# Patient Record
Sex: Male | Born: 1955 | Race: Black or African American | Hispanic: No | Marital: Married | Smoking: Never smoker
Health system: Southern US, Community
[De-identification: ages and names within clinical notes are randomized; demographics above are authoritative.]

## PROBLEM LIST (undated history)

## (undated) DIAGNOSIS — I10 Essential (primary) hypertension: Secondary | ICD-10-CM

## (undated) HISTORY — PX: KNEE SURGERY: SHX244

---

## 2020-04-27 ENCOUNTER — Other Ambulatory Visit: Payer: Self-pay

## 2020-04-27 ENCOUNTER — Emergency Department: Payer: Medicare Other

## 2020-04-27 ENCOUNTER — Emergency Department
Admission: EM | Admit: 2020-04-27 | Discharge: 2020-04-27 | Disposition: A | Discharge status: Home / Self Care | Payer: Medicare Other | Attending: Emergency Medicine | Admitting: Emergency Medicine

## 2020-04-27 DIAGNOSIS — S61213A Laceration without foreign body of left middle finger without damage to nail, initial encounter: Secondary | ICD-10-CM

## 2020-04-27 DIAGNOSIS — Z23 Encounter for immunization: Secondary | ICD-10-CM | POA: Insufficient documentation

## 2020-04-27 DIAGNOSIS — I10 Essential (primary) hypertension: Secondary | ICD-10-CM | POA: Insufficient documentation

## 2020-04-27 DIAGNOSIS — S62633B Displaced fracture of distal phalanx of left middle finger, initial encounter for open fracture: Secondary | ICD-10-CM | POA: Insufficient documentation

## 2020-04-27 DIAGNOSIS — W312XXA Contact with powered woodworking and forming machines, initial encounter: Secondary | ICD-10-CM | POA: Diagnosis not present

## 2020-04-27 DIAGNOSIS — S61211A Laceration without foreign body of left index finger without damage to nail, initial encounter: Secondary | ICD-10-CM | POA: Insufficient documentation

## 2020-04-27 DIAGNOSIS — S62639B Displaced fracture of distal phalanx of unspecified finger, initial encounter for open fracture: Secondary | ICD-10-CM

## 2020-04-27 HISTORY — DX: Essential (primary) hypertension: I10

## 2020-04-27 MED ORDER — TRAMADOL HCL 50 MG PO TABS: 50.0000 mg | ORAL_TABLET | Freq: Four times a day (QID) | ORAL | 0 refills | Status: DC | PRN

## 2020-04-27 MED ORDER — CEPHALEXIN 500 MG PO CAPS: 500.0000 mg | ORAL_CAPSULE | Freq: Four times a day (QID) | ORAL | 0 refills | Status: AC

## 2020-04-27 MED ORDER — BUPIVACAINE HCL (PF) 0.5 % IJ SOLN
10.0000 mL | Freq: Once | INTRAMUSCULAR | Status: DC
  Filled 2020-04-27: qty 10

## 2020-04-27 MED ORDER — TETANUS-DIPHTH-ACELL PERTUSSIS 5-2.5-18.5 LF-MCG/0.5 IM SUSY
0.5000 mL | PREFILLED_SYRINGE | Freq: Once | INTRAMUSCULAR | Status: AC
  Administered 2020-04-27: 0.5 mL via INTRAMUSCULAR
  Filled 2020-04-27: qty 0.5

## 2020-04-27 NOTE — ED Triage Notes (Signed)
Pt comes pov with lac across first and second finger on left hand from chainsaw. No through lacs. Bleeding controlled. Wrapped with gauze.

## 2020-04-27 NOTE — ED Notes (Signed)
Suture cart at bedside and suture kit

## 2020-04-27 NOTE — ED Provider Notes (Signed)
[facility] Emergency Department Provider Note  ____________________________________________   Event Date/Time   First MD Initiated Contact with Patient 04/27/20 1340     (approximate)  I have reviewed the triage vital signs and the nursing notes.   HISTORY  Chief Complaint Laceration    HPI Francis Guerrero is a 65 y.o. male presents emergency department complaining of laceration to the left hand today.  Patient was using a skill saw.  He was trying to build a fence.  Unsure of his last tetanus.  No numbness or tingling.  No other injuries reported - patient is right-handed   Past Medical History:  Diagnosis Date  . Hypertension     There are no problems to display for this patient.   Past Surgical History:  Procedure Laterality Date  . KNEE SURGERY Right     Prior to Admission medications   Medication Sig Start Date End Date Taking? Authorizing Provider  cephALEXin (KEFLEX) 500 MG capsule Take 1 capsule (500 mg total) by mouth 4 (four) times daily for 10 days. 04/27/20 05/07/20 Yes Fisher, Roselyn Bering, PA-C  traMADol (ULTRAM) 50 MG tablet Take 1 tablet (50 mg total) by mouth every 6 (six) hours as needed. 04/27/20  Yes Faythe Ghee, PA-C    Allergies Shellfish allergy  History reviewed. No pertinent family history.  Social History Social History   Tobacco Use  . Smoking status: Never Smoker  . Smokeless tobacco: Never Used  Substance Use Topics  . Alcohol use: Yes  . Drug use: Never    Review of Systems  Constitutional: No fever/chills Eyes: No visual changes. ENT: No sore throat. Respiratory: Denies cough Genitourinary: Negative for dysuria. Musculoskeletal: Negative for back pain.  Positive left hand laceration Skin: Negative for rash. Psychiatric: no mood changes,     ____________________________________________   PHYSICAL EXAM:  VITAL SIGNS: ED Triage Vitals  Enc Vitals Group     BP 04/27/20 1327 131/74      Pulse Rate 04/27/20 1327 62     Resp 04/27/20 1327 18     Temp 04/27/20 1327 98.2 F (36.8 C)     Temp Source 04/27/20 1327 Oral     SpO2 04/27/20 1327 99 %     Weight 04/27/20 1324 190 lb (86.2 kg)     Height 04/27/20 1324 5\' 9"  (1.753 m)     Head Circumference --      Peak Flow --      Pain Score 04/27/20 1324 3     Pain Loc --      Pain Edu? --      Excl. in GC? --     Constitutional: Alert and oriented. Well appearing and in no acute distress. Eyes: Conjunctivae are normal.  Head: Atraumatic. Nose: No congestion/rhinnorhea. Mouth/Throat: Mucous membranes are moist.   Neck:  supple no lymphadenopathy noted Cardiovascular: Normal rate, regular rhythm.  Respiratory: Normal respiratory effort.  No retractions GU: deferred Musculoskeletal: FROM all extremities, warm and well perfused, laceration noted to the knees left second and third fingers of the left hand, neurovascular is intact, no foreign body noted Neurologic:  Normal speech and language.  Skin:  Skin is warm, dry. No rash noted. Psychiatric: Mood and affect are normal. Speech and behavior are normal.  ____________________________________________   LABS (all labs ordered are listed, but only abnormal results are displayed)  Labs Reviewed - No data to display ____________________________________________   ____________________________________________  RADIOLOGY  X-ray of the left hand  ____________________________________________   PROCEDURES  Procedure(s) performed:   Marland KitchenMarland KitchenLaceration Repair  Date/Time: 04/27/2020 3:56 PM Performed by: Faythe Ghee, PA-C Authorized by: Faythe Ghee, PA-C   Consent:    Consent obtained:  Verbal   Consent given by:  Patient   Risks, benefits, and alternatives were discussed: yes     Risks discussed:  Infection, pain, retained foreign body, poor cosmetic result, tendon damage, need for additional repair, nerve damage, poor wound healing and vascular damage Universal  protocol:    Procedure explained and questions answered to patient or proxy's satisfaction: yes     Patient identity confirmed:  Verbally with patient Anesthesia:    Anesthesia method:  Nerve block   Block needle gauge:  27 G   Block anesthetic:  Bupivacaine 0.5% w/o epi   Block injection procedure:  Anatomic landmarks identified, introduced needle, incremental injection, anatomic landmarks palpated and negative aspiration for blood   Block outcome:  Anesthesia achieved Laceration details:    Location:  Finger   Finger location:  L long finger   Length (cm):  2   Depth (mm):  6 Pre-procedure details:    Preparation:  Patient was prepped and draped in usual sterile fashion Exploration:    Hemostasis achieved with:  Direct pressure   Imaging obtained: x-ray     Imaging outcome: foreign body not noted     Wound extent: underlying fracture     Wound extent: no foreign bodies/material noted, no muscle damage noted, no nerve damage noted, no tendon damage noted and no vascular damage noted   Treatment:    Area cleansed with:  Povidone-iodine and saline   Amount of cleaning:  Standard   Irrigation solution:  Sterile saline   Irrigation method:  Syringe and tap   Debridement:  Minimal   Undermining:  None   Scar revision: no   Skin repair:    Repair method:  Sutures   Suture size:  5-0   Suture material:  Nylon   Suture technique:  Simple interrupted   Number of sutures:  6 Approximation:    Approximation:  Close Repair type:    Repair type:  Simple Post-procedure details:    Dressing:  Non-adherent dressing   Procedure completion:  Tolerated well, no immediate complications .Marland KitchenLaceration Repair  Date/Time: 04/27/2020 4:00 PM Performed by: Faythe Ghee, PA-C Authorized by: Faythe Ghee, PA-C   Consent:    Consent obtained:  Verbal   Consent given by:  Patient   Risks, benefits, and alternatives were discussed: yes     Risks discussed:  Infection, pain, retained  foreign body, tendon damage, poor cosmetic result, need for additional repair, nerve damage, poor wound healing and vascular damage Universal protocol:    Procedure explained and questions answered to patient or proxy's satisfaction: yes     Patient identity confirmed:  Verbally with patient Anesthesia:    Anesthesia method:  Nerve block   Block anesthetic:  Bupivacaine 0.5% w/o epi   Block injection procedure:  Anatomic landmarks identified, introduced needle, incremental injection, anatomic landmarks palpated and negative aspiration for blood   Block outcome:  Anesthesia achieved Laceration details:    Location:  Finger   Finger location:  L index finger   Length (cm):  1 Pre-procedure details:    Preparation:  Patient was prepped and draped in usual sterile fashion Exploration:    Hemostasis achieved with:  Direct pressure   Imaging obtained: x-ray     Imaging outcome: foreign body  not noted     Wound exploration: wound explored through full range of motion     Wound extent: no foreign bodies/material noted, no muscle damage noted, no tendon damage noted, no underlying fracture noted and no vascular damage noted   Treatment:    Area cleansed with:  Povidone-iodine and saline   Amount of cleaning:  Standard   Irrigation solution:  Sterile saline   Irrigation method:  Syringe and tap   Debridement:  Minimal   Undermining:  None Skin repair:    Repair method:  Sutures   Suture size:  5-0   Suture material:  Nylon   Suture technique:  Simple interrupted   Number of sutures:  3 Approximation:    Approximation:  Close Repair type:    Repair type:  Simple Post-procedure details:    Dressing:  Non-adherent dressing   Procedure completion:  Tolerated well, no immediate complications      ____________________________________________   INITIAL IMPRESSION / ASSESSMENT AND PLAN / ED COURSE  Pertinent labs & imaging results that were available during my care of the patient  were reviewed by me and considered in my medical decision making (see chart for details).   Patient 65 year old male presents with laceration to left hand.  See HPI.  Physical exam shows patient appears stable.  Tdap updated  X-ray left hand reviewed by me confirmed by radiology to have a tuft fracture.  See procedure note for repair  I explained everything to the patient.  Explained the importance of him taking the antibiotic due to the tuft fracture in the finger having open skin.  Explained to him about infection.  He is from out of town and will want to follow-up with a orthopedic in his area.  I explained to him he should call his primary care doctor or an orthopedist that he is previously used for an appointment early next week.  He is to keep the areas clean and dry as possible.  Is placed on Keflex 500 4 times daily for 7 days.  Tramadol for pain as needed.  Tylenol and ibuprofen for pain as needed.  Return to the emergency department or an urgent care if any sign of infection.  He was discharged in stable condition in the care of his wife.     Francis Guerrero was evaluated in Emergency Department on 04/27/2020 for the symptoms described in the history of present illness. He was evaluated in the context of the global COVID-19 pandemic, which necessitated consideration that the patient might be at risk for infection with the SARS-CoV-2 virus that causes COVID-19. Institutional protocols and algorithms that pertain to the evaluation of patients at risk for COVID-19 are in a state of rapid change based on information released by regulatory bodies including the CDC and federal and state organizations. These policies and algorithms were followed during the patient's care in the ED.    As part of my medical decision making, I reviewed the following data within the electronic MEDICAL RECORD NUMBER History obtained from family, Nursing notes reviewed and incorporated, Old chart reviewed, Radiograph  reviewed , Notes from prior ED visits and [place] Controlled Substance Database  ____________________________________________   FINAL CLINICAL IMPRESSION(S) / ED DIAGNOSES  Final diagnoses:  Laceration of left index finger without foreign body, nail damage status unspecified, initial encounter  Laceration of left middle finger without foreign body, nail damage status unspecified, initial encounter  Open fracture of tuft of distal phalanx of finger  NEW MEDICATIONS STARTED DURING THIS VISIT:  Discharge Medication List as of 04/27/2020  3:38 PM    START taking these medications   Details  cephALEXin (KEFLEX) 500 MG capsule Take 1 capsule (500 mg total) by mouth 4 (four) times daily for 10 days., Starting Sat 04/27/2020, Until Tue 05/07/2020, Normal    traMADol (ULTRAM) 50 MG tablet Take 1 tablet (50 mg total) by mouth every 6 (six) hours as needed., Starting Sat 04/27/2020, Normal         Note:  This document was prepared using Dragon voice recognition software and may include unintentional dictation errors.    Faythe Ghee, PA-C 04/27/20 1603    Sharyn Creamer, MD 04/27/20 [phone]

## 2020-04-27 NOTE — ED Notes (Signed)
Patient is alert and oriented x4, with limited movement to left hand due to injury, and in no acute distress. Will continue to monitor and assess.

## 2020-04-27 NOTE — Discharge Instructions (Addendum)
Follow-up with orthopedics due to the tuft fracture of your finger with the lacerations overlying it. Follow-up with Dr. Samuel Germany office by calling and make an appointment Keep the areas clean and dry as possible Take the antibiotic as prescribed.  Pain medication as needed if Tylenol or ibuprofen are not working

## 2020-04-28 ENCOUNTER — Other Ambulatory Visit: Payer: Self-pay

## 2020-04-28 ENCOUNTER — Encounter: Payer: Self-pay | Admitting: Emergency Medicine

## 2020-04-28 ENCOUNTER — Emergency Department
Admission: EM | Admit: 2020-04-28 | Discharge: 2020-04-28 | Disposition: A | Discharge status: Home / Self Care | Payer: Medicare Other | Attending: Emergency Medicine | Admitting: Emergency Medicine

## 2020-04-28 DIAGNOSIS — R001 Bradycardia, unspecified: Secondary | ICD-10-CM | POA: Diagnosis not present

## 2020-04-28 DIAGNOSIS — R55 Syncope and collapse: Secondary | ICD-10-CM | POA: Insufficient documentation

## 2020-04-28 DIAGNOSIS — E860 Dehydration: Secondary | ICD-10-CM | POA: Diagnosis not present

## 2020-04-28 DIAGNOSIS — I10 Essential (primary) hypertension: Secondary | ICD-10-CM | POA: Insufficient documentation

## 2020-04-28 LAB — CBC
HCT: 39.6 % (ref 39.0–52.0)
Hemoglobin: 12.9 g/dL — ABNORMAL LOW (ref 13.0–17.0)
MCH: 27.3 pg (ref 26.0–34.0)
MCHC: 32.6 g/dL (ref 30.0–36.0)
MCV: 83.7 fL (ref 80.0–100.0)
Platelets: 134 10*3/uL — ABNORMAL LOW (ref 150–400)
RBC: 4.73 MIL/uL (ref 4.22–5.81)
RDW: 13.2 % (ref 11.5–15.5)
WBC: 4.6 10*3/uL (ref 4.0–10.5)
nRBC: 0 % (ref 0.0–0.2)

## 2020-04-28 LAB — BASIC METABOLIC PANEL
Anion gap: 9 (ref 5–15)
BUN: 27 mg/dL — ABNORMAL HIGH (ref 8–23)
CO2: 24 mmol/L (ref 22–32)
Calcium: 8.4 mg/dL — ABNORMAL LOW (ref 8.9–10.3)
Chloride: 104 mmol/L (ref 98–111)
Creatinine, Ser: 1.35 mg/dL — ABNORMAL HIGH (ref 0.61–1.24)
GFR, Estimated: 58 mL/min — ABNORMAL LOW (ref >60)
Glucose, Bld: 150 mg/dL — ABNORMAL HIGH (ref 70–99)
Potassium: 4 mmol/L (ref 3.5–5.1)
Sodium: 137 mmol/L (ref 135–145)

## 2020-04-28 MED ORDER — LACTATED RINGERS IV BOLUS
1000.0000 mL | Freq: Once | INTRAVENOUS | Status: AC
  Administered 2020-04-28: 1000 mL via INTRAVENOUS

## 2020-04-28 NOTE — ED Provider Notes (Signed)
[facility] Emergency Department Provider Note   ____________________________________________   Event Date/Time   First MD Initiated Contact with Patient 04/28/20 [phone]     (approximate)  I have reviewed the triage vital signs and the nursing notes.   HISTORY  Chief Complaint Loss of Consciousness    HPI Francis Guerrero is a 65 y.o. male with a history of hypertension  Patient cut his left hand using a saw yesterday.  Today he got up and was seated changing his bandage when he started feeling lightheaded and nauseated.  His wife was sitting next to him and he passed out briefly.  She was able to catch him and he did not fall or become injured, but his eyes rolled back in his head and he was fairly unresponsive for about 30 seconds when he started to come back to and reported he had fallen like "asleep"  He feels perfectly fine now.  He reports that this happened while he was getting his bandage off and just started to feel very lightheaded nauseated and then felt as though he had fallen asleep briefly  He denies any injuries.  His hand seems to be healing well not much pain.  He is taking cephalexin.  No rashes no trouble breathing no wheezing.  No itching.  He has not taken or started the tramadol he received, wife reports they do not intend to use it  He is never passed out before  Has no history of heart disease.  He does tell me that he is always been told in the past his resting heart rate is somewhere around 42-45 sometimes lower.  This is been a longstanding thing   Past Medical History:  Diagnosis Date  . Hypertension     There are no problems to display for this patient.   Past Surgical History:  Procedure Laterality Date  . KNEE SURGERY Right     Prior to Admission medications   Medication Sig Start Date End Date Taking? Authorizing Provider  cephALEXin (KEFLEX) 500 MG capsule Take 1 capsule (500 mg total) by mouth 4 (four) times  daily for 10 days. 04/27/20 05/07/20  Faythe Ghee, PA-C    Allergies Shellfish allergy  History reviewed. No pertinent family history.  Social History Social History   Tobacco Use  . Smoking status: Never Smoker  . Smokeless tobacco: Never Used  Substance Use Topics  . Alcohol use: Yes  . Drug use: Never    Review of Systems Constitutional: No fever/chills Eyes: No visual changes. ENT: No sore throat. Cardiovascular: Denies chest pain. Respiratory: Denies shortness of breath. Gastrointestinal: No abdominal pain.  Felt a little nauseated right before he passed out but that is gone away feels hungry now Genitourinary: Negative for dysuria. Musculoskeletal: Negative for back pain.  Fingers feel like they are healing well. Skin: Negative for rash. Neurological: Negative for headaches, areas of focal weakness or numbness.    ____________________________________________   PHYSICAL EXAM:  VITAL SIGNS: ED Triage Vitals  Enc Vitals Group     BP 04/28/20 0902 133/72     Pulse Rate 04/28/20 0902 (!) 40     Resp 04/28/20 0902 18     Temp 04/28/20 0902 (!) 97.5 F (36.4 C)     Temp Source 04/28/20 0902 Oral     SpO2 04/28/20 0902 100 %     Weight 04/28/20 0859 189 lb 9.5 oz (86 kg)     Height 04/28/20 0859 5\' 9"  (1.753  m)     Head Circumference --      Peak Flow --      Pain Score 04/28/20 0859 0     Pain Loc --      Pain Edu? --      Excl. in GC? --     Constitutional: Alert and oriented. Well appearing and in no acute distress. Eyes: Conjunctivae are normal. Head: Atraumatic. Nose: No congestion/rhinnorhea. Mouth/Throat: Mucous membranes are moist. Neck: No stridor.  Cardiovascular: Somewhat bradycardic rate, regular rhythm. Grossly normal heart sounds.  Good peripheral circulation. Respiratory: Normal respiratory effort.  No retractions. Lungs CTAB. Gastrointestinal: Soft and nontender. No distention. Musculoskeletal: No lower extremity tenderness nor edema.   All digits of the left hand warm well perfused.  Bandages appear appropriate without discharges. Neurologic:  Normal speech and language. No gross focal neurologic deficits are appreciated.  Skin:  Skin is warm, dry and intact. No rash noted. Psychiatric: Mood and affect are normal. Speech and behavior are normal.  ____________________________________________   LABS (all labs ordered are listed, but only abnormal results are displayed)  Labs Reviewed  BASIC METABOLIC PANEL - Abnormal; Notable for the following components:      Result Value   Glucose, Bld 150 (*)    BUN 27 (*)    Creatinine, Ser 1.35 (*)    Calcium 8.4 (*)    GFR, Estimated 58 (*)    All other components within normal limits  CBC - Abnormal; Notable for the following components:   Hemoglobin 12.9 (*)    Platelets 134 (*)    All other components within normal limits  CBG MONITORING, ED   ____________________________________________  EKG  Reviewed inter by me at 9 AM Heart rate 40 QRS 110 QTc 4 400 Sinus bradycardia, no evidence of ischemia.  No evidence of heart block.  No prolonged QT ____________________________________________  RADIOLOGY   ____________________________________________   PROCEDURES  Procedure(s) performed: None  Procedures  Critical Care performed: No  ____________________________________________   INITIAL IMPRESSION / ASSESSMENT AND PLAN / ED COURSE  Pertinent labs & imaging results that were available during my care of the patient were reviewed by me and considered in my medical decision making (see chart for details).   Patient presents after a syncopal episode.  This occurred after being at home, was changing his bandage became lightheaded and then had a brief syncopal episode.  He had returned to complete normal neurologic exam.  He is very reassuring examination has mild bradycardia appears sinus in nature and he reports this is somewhat chronic as well.  Very reassuring  exam, he does relate however the last 2 days he spent quite a bit of time outside working and he may have had some associated dehydration as well.  [place] syncope rules are negative.  He is fully awake and alert.  His lab work and EKG very reassuring.----------------------------------------- 12:47 PM on 04/28/2020 -----------------------------------------  Patient feeling much better.  Has been up moved about no lightheadedness.  Feels much improved.  Requesting to be discharged, and I think this is reasonable.  He may follow-up with his primary care physician  Return precautions and treatment recommendations and follow-up discussed with the patient and his wife both agreeable with the plan.       ____________________________________________   FINAL CLINICAL IMPRESSION(S) / ED DIAGNOSES  Final diagnoses:  Vasovagal syncope  Mild dehydration        Note:  This document was prepared using Dragon voice recognition software  and may include unintentional dictation errors       Sharyn Creamer, MD 04/28/20 1247

## 2020-04-28 NOTE — ED Triage Notes (Signed)
Pt to ER via EMS form home after having 2 syncopal episodes within 30sec.  Pt was unconscious for secs with each episode.  Pt was seen her yesterday for laceration to thumb and received sutures.  PT arrives A&O x 4, warm and dry, NAD noted.

## 2022-06-14 IMAGING — DX DG HAND COMPLETE 3+V*L*
3 series · 3 of 3 positions shown · non-contrast
Comparison: None.

CLINICAL DATA: Laceration to LEFT fingers from chainsaw. Initial
encounter.

EXAM:
LEFT HAND - COMPLETE 3+ VIEW

[hand ap]
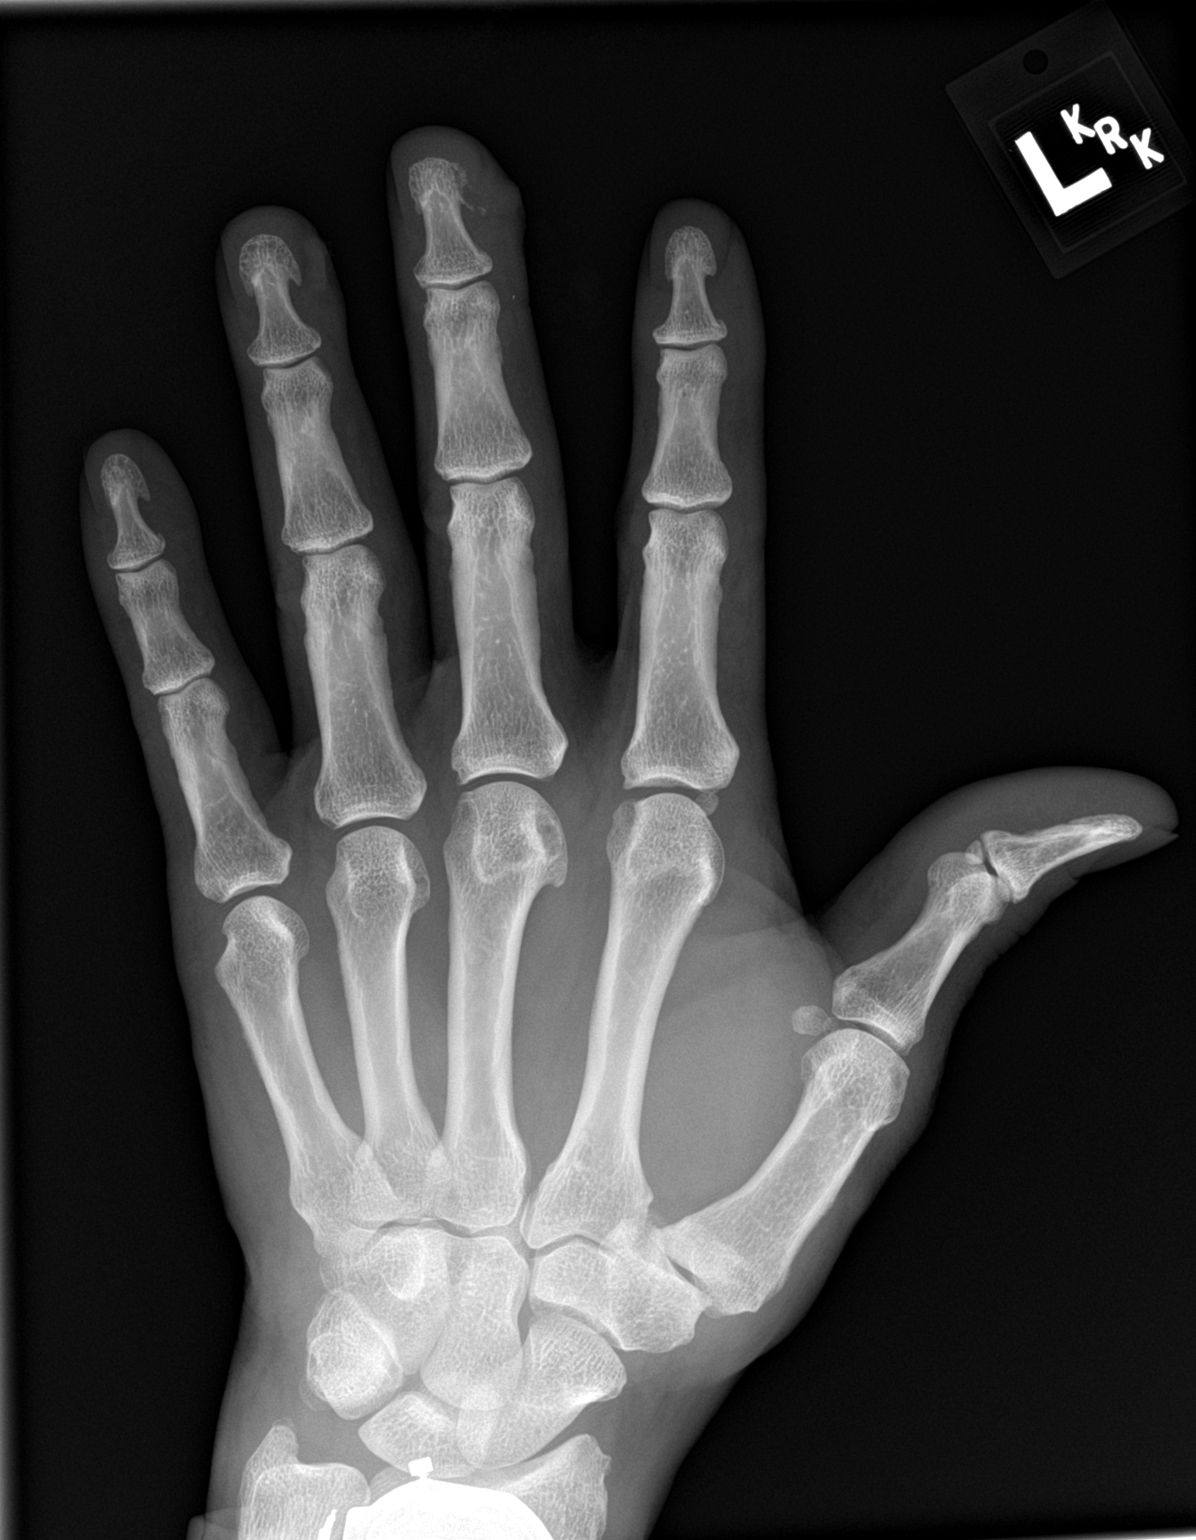

[hand obl]
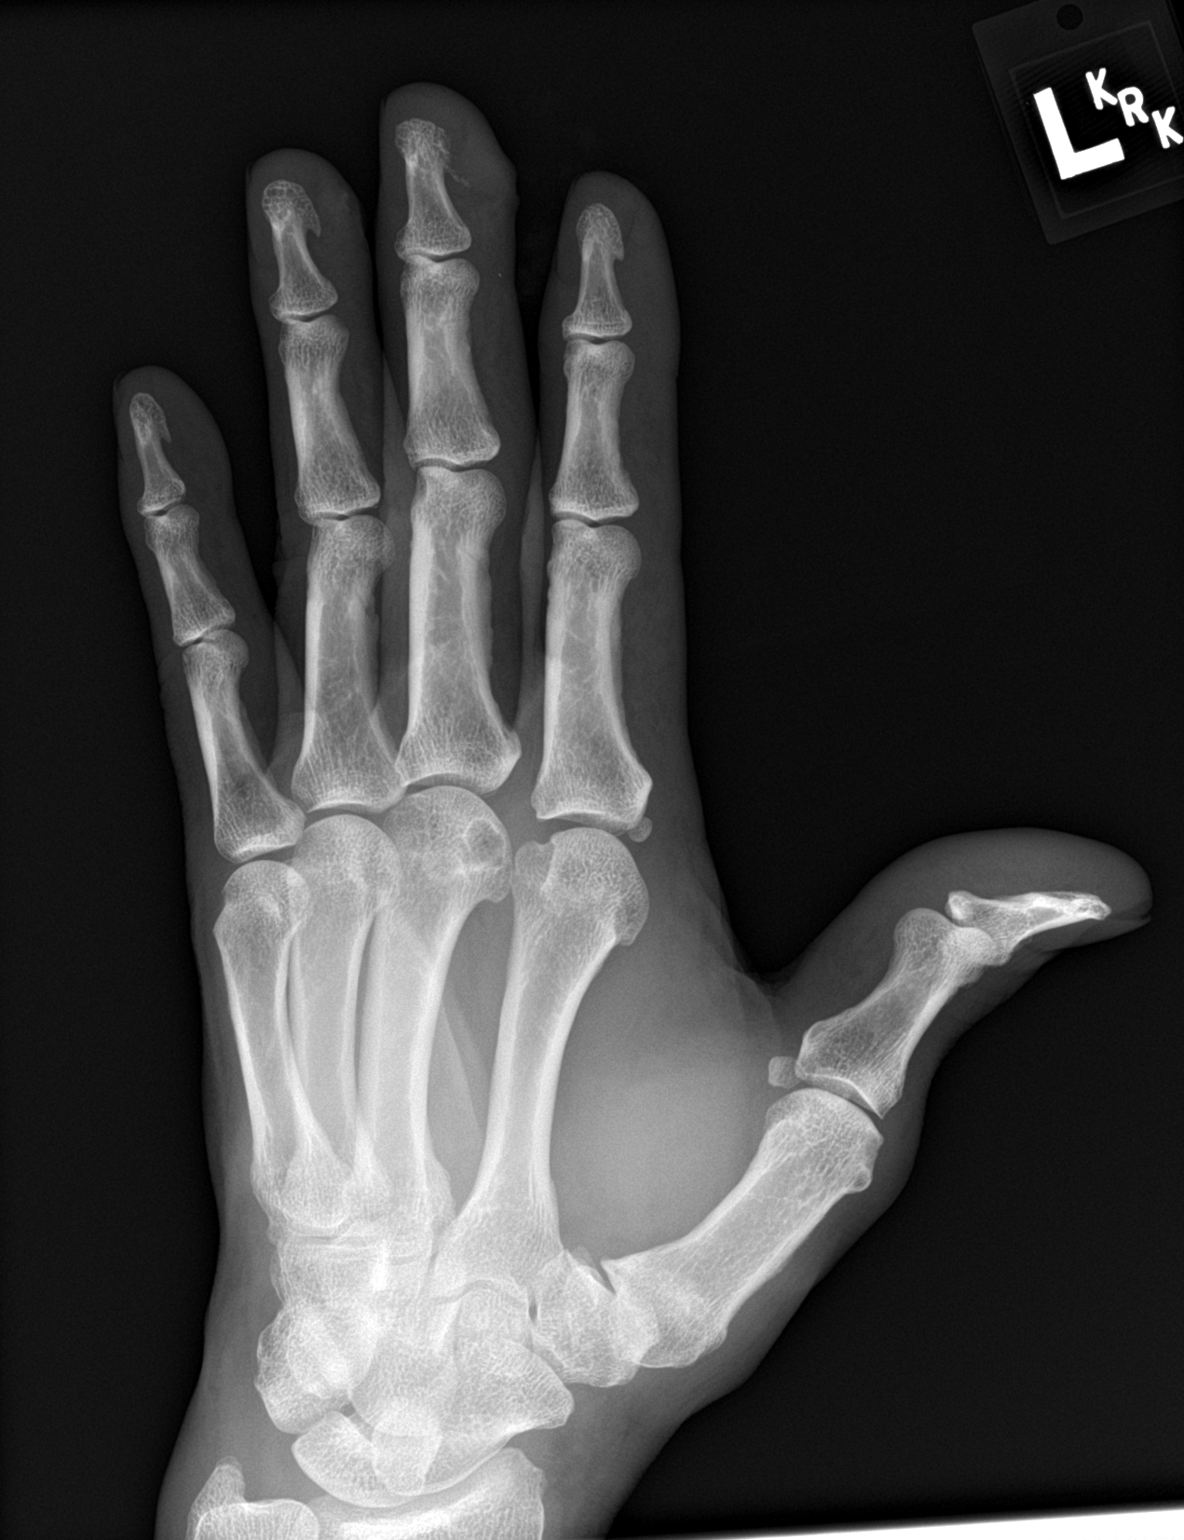

[hand lat]
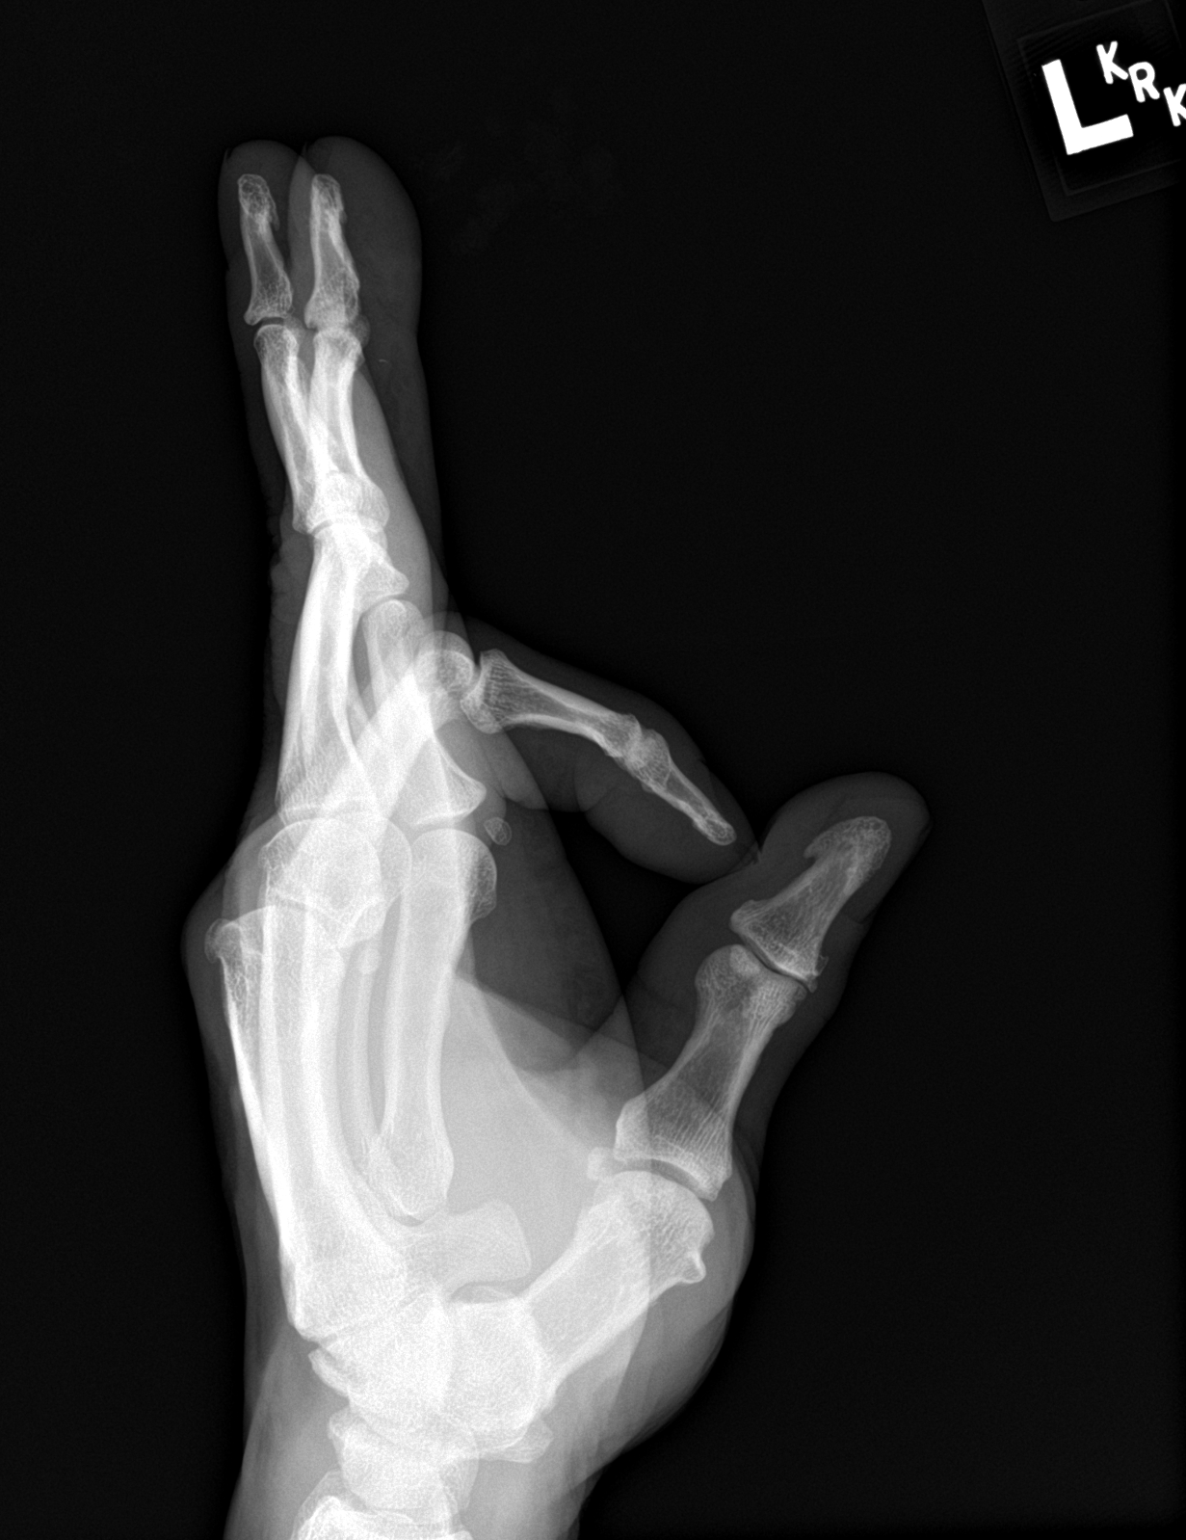

[3 of 3 positions shown; findings below may reference images not displayed]

FINDINGS: Fracture of the middle finger tuft noted with a few small bony
fragments noted.

No other acute fracture, subluxation or dislocation.

No radiopaque foreign bodies are noted.
IMPRESSION: Middle finger tuft fracture with small bony fragments.
